# Patient Record
Sex: Male | Born: 1950 | Hispanic: No | Marital: Single | Smoking: Current every day smoker
Health system: Southern US, Community
[De-identification: ages and names within clinical notes are randomized; demographics above are authoritative.]

## PROBLEM LIST (undated history)

## (undated) DIAGNOSIS — Z720 Tobacco use: Secondary | ICD-10-CM

## (undated) DIAGNOSIS — R319 Hematuria, unspecified: Secondary | ICD-10-CM

## (undated) HISTORY — DX: Tobacco use: Z72.0

## (undated) HISTORY — DX: Hematuria, unspecified: R31.9

## (undated) HISTORY — PX: CYSTOSCOPY: SUR368

---

## 2008-05-30 DIAGNOSIS — R319 Hematuria, unspecified: Secondary | ICD-10-CM

## 2008-05-30 HISTORY — DX: Hematuria, unspecified: R31.9

## 2010-05-11 ENCOUNTER — Emergency Department (HOSPITAL_COMMUNITY)
Admission: EM | Admit: 2010-05-11 | Discharge: 2010-05-11 | Payer: Self-pay | Source: Home / Self Care | Admitting: Emergency Medicine

## 2017-12-01 ENCOUNTER — Ambulatory Visit (INDEPENDENT_AMBULATORY_CARE_PROVIDER_SITE_OTHER): Payer: Medicare HMO | Admitting: Internal Medicine

## 2017-12-01 ENCOUNTER — Encounter: Payer: Self-pay | Admitting: Internal Medicine

## 2017-12-01 ENCOUNTER — Other Ambulatory Visit: Payer: Self-pay

## 2017-12-01 VITALS — BP 114/75 | HR 77 | Temp 98.6°F | Ht 72.0 in | Wt 134.5 lb

## 2017-12-01 DIAGNOSIS — Z87891 Personal history of nicotine dependence: Secondary | ICD-10-CM | POA: Diagnosis not present

## 2017-12-01 DIAGNOSIS — R3912 Poor urinary stream: Secondary | ICD-10-CM | POA: Diagnosis not present

## 2017-12-01 DIAGNOSIS — R319 Hematuria, unspecified: Secondary | ICD-10-CM | POA: Diagnosis not present

## 2017-12-01 DIAGNOSIS — Z0000 Encounter for general adult medical examination without abnormal findings: Secondary | ICD-10-CM

## 2017-12-01 DIAGNOSIS — R310 Gross hematuria: Secondary | ICD-10-CM | POA: Insufficient documentation

## 2017-12-01 DIAGNOSIS — R768 Other specified abnormal immunological findings in serum: Secondary | ICD-10-CM

## 2017-12-01 LAB — POCT URINALYSIS DIPSTICK
Bilirubin, UA: NEGATIVE
Glucose, UA: NEGATIVE
KETONES UA: NEGATIVE
Nitrite, UA: NEGATIVE
PROTEIN UA: POSITIVE — AB
Spec Grav, UA: 1.025 (ref 1.010–1.025)
UROBILINOGEN UA: 1 U/dL
pH, UA: 5.5 (ref 5.0–8.0)

## 2017-12-01 NOTE — Assessment & Plan Note (Addendum)
Assessment & Plan:  Patient denied any nocturia or difficulty initiating urination but does admit to a weak urinary stream at times but does feel like he empties his bladder completely. Referring to urology as above.

## 2017-12-01 NOTE — Assessment & Plan Note (Addendum)
Assessment:  Patient reports a 9-year history of hematuria which he first noticed this when started passing large blood clots in 2010. A friend gave him a medicine (unsure of what) which he thinks resolved the bleeding although it recurred several days later. Reports being seen by a urologist who performed a cystoscopy and saw something that 'if it wasn't removed would turn into cancer.' He reports having a procedure (couldn't give more details) and missing his follow-up cystoscopy (because it was too painful). He denies any family history of malignancy.  He "knows" something isn't right but hasn't seen a doctor since 2013 because he's afraid of needles. Bleeding can last anywhere from 1-3 weeks with 2-3 days without obvious blood between episodes. Admits to some weight loss recently (relays this is from travel and not liking the food) and weak stream but otherwise feels well. Has occasional dysuria which is resolved with increased water intake (pt states he drinks very little fluid). UA dipstick in clinic with sediment and large blood.   Plan: Obviously his nearly 10-year history of hematuria and history of abnormal cystoscopy is concerning for urologic malignancy in this former smoker.  Will refer patient urgently to Urology and obtain US Kidneys and Bladder to evaluate for large masses as patients renal function is unknown.  -Referral to Urology -Formal UA with microscopy -CBC w diff -Ferritin -Renal and bladder [place]

## 2017-12-01 NOTE — Patient Instructions (Signed)
  Fue genial conocerte hoy. Gracias por venir a la oficina de los mdicos para ser visto.  Gracias por hablar sobre sus sntomas urinarios. Es importante que lo llevemos al mdico de vejiga (urlogo).  Tambin orden un ultrasonido, que es un estudio de imgenes sin Engineer, miningdolor. Esto mirar sus riones y Geophysicist/field seismologistvejiga.  Tambin he ordenado varios laboratorios. Por favor, pase por el laboratorio en su salida.   Te llamarn con las citas.  Por favor regrese a la Market researcherclnica dentro de 1 mes para seguimiento.

## 2017-12-01 NOTE — Assessment & Plan Note (Addendum)
Assessment & Plan: Obtaining HIV and HepC screens and CMET with todays labs. He has never had a colonoscopy however given his hematuria and concern for urologic malignancy, will defer for now.

## 2017-12-01 NOTE — Progress Notes (Signed)
Medicine attending: Medical history, presenting problems, physical findings, and medications, reviewed with resident physician Dr Toma CopierBethany Molt on the day of the patient visit and I concur with her evaluation and management plan. New to clinic. Hematuria intermittent X 10 years! Declined follow up at time of initial eval.  Needs baseline labs, US KUB, Urology referral.

## 2017-12-01 NOTE — Progress Notes (Signed)
   CC: Establish care, hematuria  HPI:  [name] is a Spanish-Speaking 67 y.o. M who presents today to establish care. He has no acute complaints today but does discuss a 9-year history of hematuria. The interview was conducted using a Radiation protection practitionervideo translator.   For details regarding today's visit and the status of their chronic medical issues, please refer to the assessment and plan.  Medical & Surgical History:  -Hematuria since 2010 -Former smoker -No surgeries  Family History: Patient reports no significant family history and denied history of MI, CVA or malignancy in his mother or father. Mother lived to be 6595 and father lived to be [address]. He reports he is one of 32 children.  Social History: Remote smoking history, quit in 1990's. About 2-pack years total. Drinks maybe 1-2 sips of alcohol at holiday functions. Denied drug use. 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 He is from the [place]. In domestic partnership.  Review of Systems:   General: Admits to 10-lb weight loss recently. Denies fevers, chills, or excessive fatigue HEENT: Denies headache, recurrent nosebleeds, changes in vision, sore throat, dysphagia Cardiac: Denies CP, SOB, palpitations Pulmonary: Denies cough, wheezes Abd: Denies abdominal pain, pelvic pain or fullness, nausea, vomiting, diarrhea, melena, hematochezia GU: Admits to hematuria and occasional dysuria. Admits to weak stream. Denies increased frequency, nocturia or incomplete emptying of bladder Extremities: Denies weakness or swelling  Physical Exam: General: Thin. Alert, in no acute distress. Pleasant and conversant HEENT: No icterus, injection or ptosis. No hoarseness or dysarthria. Oropharynx without erythema or exudate. MMM.  Cardiac: RRR, no MGR Pulmonary: CTA BL with normal WOB on RA. Able to speak in complete sentences Abd: Soft, non-tender and not distended. No appreciable abdominal masses. Liver palpable just  under costal margin. +bs Extremities: Warm, perfused. No significant pedal edema.   Vitals:   12/01/17 1337  BP: 114/75  Pulse: 77  Temp: 98.6 F (37 C)  TempSrc: Oral  SpO2: 100%  Weight: 134 lb 8 oz (61 kg)  Height: 6' (1.829 m)   Body mass index is 18.24 kg/m.  Assessment & Plan:   See Encounters Tab for problem based charting.  Patient discussed with Dr. Cyndie ChimeGranfortuna

## 2017-12-02 LAB — CBC WITH DIFFERENTIAL/PLATELET
BASOS ABS: 0 10*3/uL (ref 0.0–0.2)
BASOS: 1 %
EOS (ABSOLUTE): 0.1 10*3/uL (ref 0.0–0.4)
Eos: 1 %
Hematocrit: 44.2 % (ref 37.5–51.0)
Hemoglobin: 14.7 g/dL (ref 13.0–17.7)
IMMATURE GRANS (ABS): 0 10*3/uL (ref 0.0–0.1)
Immature Granulocytes: 0 %
LYMPHS ABS: 2.1 10*3/uL (ref 0.7–3.1)
Lymphs: 36 %
MCH: 29.2 pg (ref 26.6–33.0)
MCHC: 33.3 g/dL (ref 31.5–35.7)
MCV: 88 fL (ref 79–97)
MONOS ABS: 0.5 10*3/uL (ref 0.1–0.9)
Monocytes: 9 %
NEUTROS ABS: 3.1 10*3/uL (ref 1.4–7.0)
Neutrophils: 53 %
PLATELETS: 211 10*3/uL (ref 150–450)
RBC: 5.04 x10E6/uL (ref 4.14–5.80)
RDW: 13.4 % (ref 12.3–15.4)
WBC: 5.8 10*3/uL (ref 3.4–10.8)

## 2017-12-02 LAB — MICROSCOPIC EXAMINATION
BACTERIA UA: NONE SEEN
Casts: NONE SEEN /lpf
RBC, UA: >30 /hpf — AB (ref 0–2)
WBC, UA: >30 /hpf — AB (ref 0–5)

## 2017-12-02 LAB — URINALYSIS, COMPLETE
Bilirubin, UA: NEGATIVE
Glucose, UA: NEGATIVE
KETONES UA: NEGATIVE
NITRITE UA: NEGATIVE
PH UA: 5.5 (ref 5.0–7.5)
SPEC GRAV UA: 1.016 (ref 1.005–1.030)
Urobilinogen, Ur: 1 mg/dL (ref 0.2–1.0)

## 2017-12-02 LAB — CMP14 + ANION GAP
A/G RATIO: 2.1 (ref 1.2–2.2)
ALT: 23 IU/L (ref 0–44)
AST: 26 IU/L (ref 0–40)
Albumin: 4.2 g/dL (ref 3.6–4.8)
Alkaline Phosphatase: 79 IU/L (ref 39–117)
Anion Gap: 13 mmol/L (ref 10.0–18.0)
BILIRUBIN TOTAL: 0.5 mg/dL (ref 0.0–1.2)
BUN / CREAT RATIO: 24 (ref 10–24)
BUN: 27 mg/dL (ref 8–27)
CALCIUM: 9 mg/dL (ref 8.6–10.2)
CHLORIDE: 107 mmol/L — AB (ref 96–106)
CO2: 23 mmol/L (ref 20–29)
Creatinine, Ser: 1.14 mg/dL (ref 0.76–1.27)
GFR, EST AFRICAN AMERICAN: 77 mL/min/{1.73_m2} (ref >59)
GFR, EST NON AFRICAN AMERICAN: 66 mL/min/{1.73_m2} (ref >59)
GLUCOSE: 67 mg/dL (ref 65–99)
Globulin, Total: 2 g/dL (ref 1.5–4.5)
POTASSIUM: 4.4 mmol/L (ref 3.5–5.2)
Sodium: 143 mmol/L (ref 134–144)
TOTAL PROTEIN: 6.2 g/dL (ref 6.0–8.5)

## 2017-12-02 LAB — FERRITIN: FERRITIN: 44 ng/mL (ref 30–400)

## 2017-12-02 LAB — HEPATITIS C ANTIBODY: Hep C Virus Ab: >11 s/co ratio — ABNORMAL HIGH (ref 0.0–0.9)

## 2017-12-02 LAB — HIV ANTIBODY (ROUTINE TESTING W REFLEX): HIV Screen 4th Generation wRfx: NONREACTIVE

## 2017-12-04 DIAGNOSIS — R768 Other specified abnormal immunological findings in serum: Secondary | ICD-10-CM | POA: Insufficient documentation

## 2017-12-04 NOTE — Assessment & Plan Note (Signed)
Addendum: HepC Ab + on routine health screen. Getting HepC RNA load; further management based on result.

## 2017-12-04 NOTE — Addendum Note (Signed)
Addended by: Mable FillMOLT, Totiana Everson L on: 12/04/2017 08:56 AM   Modules accepted: Orders

## 2017-12-06 ENCOUNTER — Other Ambulatory Visit (INDEPENDENT_AMBULATORY_CARE_PROVIDER_SITE_OTHER): Payer: Medicare HMO

## 2017-12-06 DIAGNOSIS — R768 Other specified abnormal immunological findings in serum: Secondary | ICD-10-CM | POA: Diagnosis not present

## 2017-12-08 ENCOUNTER — Other Ambulatory Visit: Payer: Self-pay | Admitting: Internal Medicine

## 2017-12-08 DIAGNOSIS — R768 Other specified abnormal immunological findings in serum: Secondary | ICD-10-CM

## 2017-12-08 LAB — HCV RNA QUANT
HCV log10: 5.501 log10 IU/mL
HEPATITIS C QUANTITATION: 317000 [IU]/mL

## 2017-12-13 ENCOUNTER — Encounter: Payer: Self-pay | Admitting: Internal Medicine

## 2017-12-15 ENCOUNTER — Encounter: Payer: Self-pay | Admitting: Family

## 2017-12-15 ENCOUNTER — Ambulatory Visit (INDEPENDENT_AMBULATORY_CARE_PROVIDER_SITE_OTHER): Payer: Medicare HMO | Admitting: Family

## 2017-12-15 VITALS — BP 122/78 | HR 65 | Temp 97.7°F | Wt 132.0 lb

## 2017-12-15 DIAGNOSIS — B182 Chronic viral hepatitis C: Secondary | ICD-10-CM

## 2017-12-15 NOTE — Progress Notes (Signed)
Subjective:    Patient ID: Gregory Davis, male    DOB: 15-Nov-1950, 67 yGregoryo.   MRN: [id number]  Chief Complaint  Patient presents with  . Hepatitis C    HPI:  Gregory Davis is a 67 yGregoryo. male who presents today for initial evaluation and treatment of Hepatitis C. MrGregory Davis speaks some English and requesting that his wife translate and refuses Radiation protection practitioner.   Gregory Davis,. Gregory Davis was first diagnosed with Hepatitis C in 2011. He had a positive antibody test on 7/5 and found to have a viral load of 317,000. He has a history of cocaine use but has not used IV drugs, and denies tattoos, sharing of razors/toothbrushes. He has never been treated for Hepatitis C. Originally from the [place] and believes that he may have acquired Hepatitis C during vaccinations. He has received the Hepatitis B vaccination in the past. He is currently asymptomatic. No personal or family history of liver disease.   No Known Allergies    No outpatient medications prior to visit.   No facility-administered medications prior to visit.      Past Medical History:  Diagnosis Date  . Hematuria 2010  . Tobacco use    Briefly in 90s      Past Surgical History:  Procedure Laterality Date  . CYSTOSCOPY     2010 at [facility] in [place] 'saw something that if it wasnt removed would turn into cancer'      No family history on file.    Social History   Socioeconomic History  . Marital status: Single    Spouse name: Not on file  . Number of children: Not on file  . Years of education: Not on file  . Highest education level: Not on file  Occupational History  . Not on file  Social Needs  . Financial resource strain: Not on file  . Food insecurity:    Worry: Not on file    Inability: Not on file  . Transportation needs:    Medical: Not on file    Non-medical: Not on file  Tobacco Use  . Smoking status: Current Every Day Smoker  . Smokeless tobacco: Never Used  Substance and Sexual  Activity  . Alcohol use: Yes    Comment: 1-2 sips at holidays  . Drug use: Never  . Sexual activity: Not Currently  Lifestyle  . Physical activity:    Days per week: Not on file    Minutes per session: Not on file  . Stress: Not on file  Relationships  . Social connections:    Talks on phone: Not on file    Gets together: Not on file    Attends religious service: Not on file    Active member of club or organization: Not on file    Attends meetings of clubs or organizations: Not on file    Relationship status: Not on file  . Intimate partner violence:    Fear of current or ex partner: Not on file    Emotionally abused: Not on file    Physically abused: Not on file    Forced sexual activity: Not on file  Other Topics Concern  . Not on file  Social History Narrative  . Not on file      Review of Systems  Constitutional: Negative for chills, diaphoresis, fatigue and fever.  Respiratory: Negative for cough, chest tightness, shortness of breath and wheezing.   Cardiovascular: Negative for chest pain.  Gastrointestinal: Negative for  abdominal distention, abdominal pain, constipation, diarrhea, nausea and vomiting.  Neurological: Negative for weakness and headaches.  Hematological: Does not bruise/bleed easily.       Objective:    BP 122/78   Pulse 65   Temp 97Gregory7 F (36Gregory5 C) (Oral)   Wt 132 lb (59Gregory9 kg)   BMI 17Gregory90 kg/m  Nursing note and vital signs reviewed.  Physical Exam  Constitutional: He is oriented to person, place, and time. He appears well-developed. No distress.  Cardiovascular: Normal rate, regular rhythm, normal heart sounds and intact distal pulses. Exam reveals no gallop and no friction rub.  No murmur heard. Pulmonary/Chest: Effort normal and breath sounds normal. No respiratory distress. He has no wheezes. He has no rales. He exhibits no tenderness.  Abdominal: Soft. Bowel sounds are normal. He exhibits no distension, no ascites and no mass. There is  no hepatosplenomegaly. There is no tenderness. There is no rebound and no guarding.  Neurological: He is alert and oriented to person, place, and time.  Skin: Skin is warm and dry.  Psychiatric: He has a normal mood and affect. His behavior is normal. Judgment and thought content normal.        Assessment & Plan:   Problem List Items Addressed This Visit      Digestive   Chronic hepatitis C without hepatic coma (HCC) - Primary    Gregory Davis. Gregory Davis has chronic Hepatitis C which was initially diagnosed in 2011 and he has never received treatment. His most recent blood work confirmed the presence of the virus with a viral load of 317,000. He believes he likely acquired Hepatitis C from previously received vaccination although does have history of cocaine usage. He is asymptomatic on physical exam and liver function tests are within the normal ranges. We discussed the transmission, progression, and available treatments. He was encouraged to avoid alcohol and limit acetaminophen intake. I will check his Genotype today and obtain elastography to determine fibrosis. I will also check his immune status of Hepatitis B.  Plan for likely treatment with Epclusa or Harvoni pending imaging and lab results.       Relevant Orders   US ABDOMEN COMPLETE W/ELASTOGRAPHY   Hepatitis C genotype   Hepatitis B surface antigen   Hepatitis C Antibody      Gregory Davis does not currently have medications on file.   Follow-up: Return pending ultrasound and lab work results.   Jeanine LuzGregory Codee Tutson, FNP [facility]

## 2017-12-15 NOTE — Assessment & Plan Note (Signed)
[name] has chronic Hepatitis C which was initially diagnosed in 2011 and he has never received treatment. His most recent blood work confirmed the presence of the virus with a viral load of 317,000. He believes he likely acquired Hepatitis C from previously received vaccination although does have history of cocaine usage. He is asymptomatic on physical exam and liver function tests are within the normal ranges. We discussed the transmission, progression, and available treatments. He was encouraged to avoid alcohol and limit acetaminophen intake. I will check his Genotype today and obtain elastography to determine fibrosis. I will also check his immune status of Hepatitis B.  Plan for likely treatment with Epclusa or Harvoni pending imaging and lab results.

## 2017-12-15 NOTE — Patient Instructions (Signed)
Nice to meet you!  We will check your lab work today.   We will schedule you for an ultrasound of your abdomen and liver.  Once we have all the information we will send in your medication.

## 2017-12-19 LAB — HEPATITIS C GENOTYPE

## 2017-12-20 ENCOUNTER — Ambulatory Visit (HOSPITAL_COMMUNITY)
Admission: RE | Admit: 2017-12-20 | Discharge: 2017-12-20 | Disposition: A | Discharge status: Home / Self Care | Payer: Medicare HMO | Source: Ambulatory Visit | Attending: Family | Admitting: Family

## 2017-12-20 ENCOUNTER — Other Ambulatory Visit: Payer: Self-pay | Admitting: Family

## 2017-12-20 DIAGNOSIS — B182 Chronic viral hepatitis C: Secondary | ICD-10-CM

## 2017-12-20 LAB — HEPATITIS C ANTIBODY
HEP C AB: REACTIVE — AB
SIGNAL TO CUT-OFF: 29 — AB (ref <1.00)

## 2017-12-20 LAB — HEPATITIS B SURFACE ANTIGEN: Hepatitis B Surface Ag: NONREACTIVE

## 2017-12-20 LAB — HCV RNA,QUANTITATIVE REAL TIME PCR
HCV Quantitative Log: 6.07 Log IU/mL — ABNORMAL HIGH
HCV RNA, PCR, QN: 1180000 IU/mL — ABNORMAL HIGH

## 2017-12-20 MED ORDER — LEDIPASVIR-SOFOSBUVIR 90-400 MG PO TABS: 1.0000 | ORAL_TABLET | Freq: Every day | ORAL | 1 refills | Status: AC

## 2017-12-25 ENCOUNTER — Telehealth: Payer: Self-pay | Admitting: Pharmacist

## 2017-12-25 NOTE — Telephone Encounter (Signed)
Patient has been approved for Harvoni x 8 weeks. Spoke with patient today and counseled on how to take Harvoni including one pill once daily at the same time each day.  Counseled on the importance of not missing any doses once starting.  Counseled on possible side effects such as fatigue, nausea, and headaches.  He will come to the clinic tomorrow to pick up his 1st month. He is not on any medications OTC or prescription for acid reflux.

## 2017-12-26 ENCOUNTER — Encounter: Payer: Self-pay | Admitting: Pharmacy Technician

## 2017-12-26 ENCOUNTER — Ambulatory Visit: Payer: Medicare HMO | Admitting: Pharmacy Technician

## 2018-01-03 ENCOUNTER — Ambulatory Visit (HOSPITAL_COMMUNITY): Payer: Medicare HMO

## 2018-01-25 ENCOUNTER — Ambulatory Visit: Payer: Medicare HMO

## 2018-03-13 ENCOUNTER — Ambulatory Visit: Payer: Medicare HMO

## 2019-01-06 IMAGING — US US ABDOMEN COMPLETE W/ ELASTOGRAPHY
1 series · 13 of 25 positions shown · non-contrast
Comparison: None.

CLINICAL DATA: Chronic hepatitis-C without hepatic coma.



[Series 1: us abdomen complete w/ elastography · 0.17mm/px · 13 of 88 slices shown]
[im 1/88]
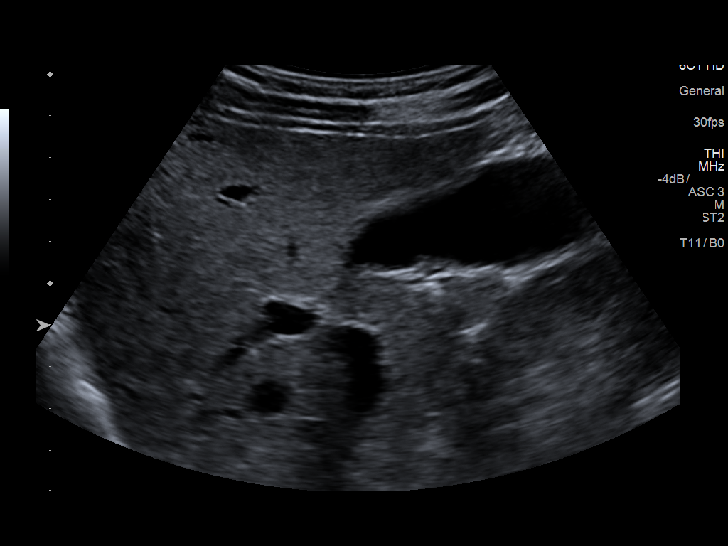
[im 8/88]
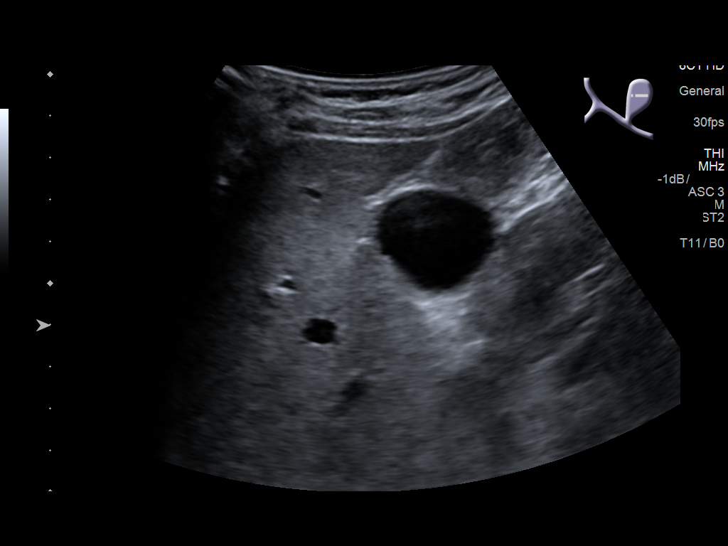
[im 15/88]
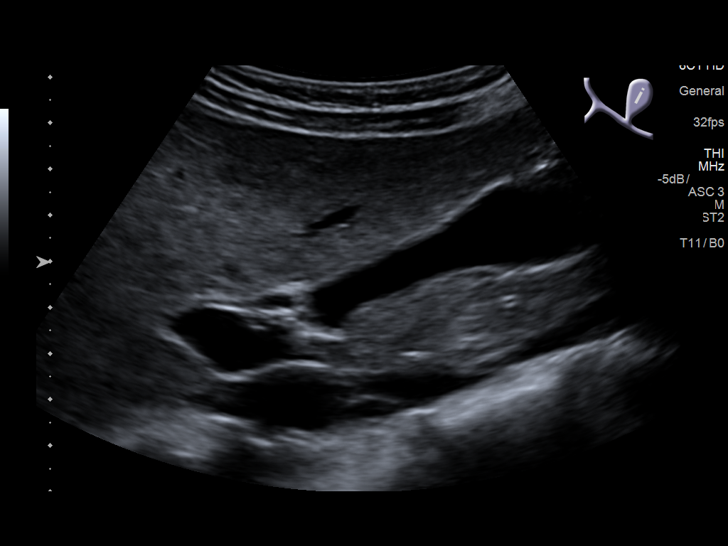
[im 22/88]
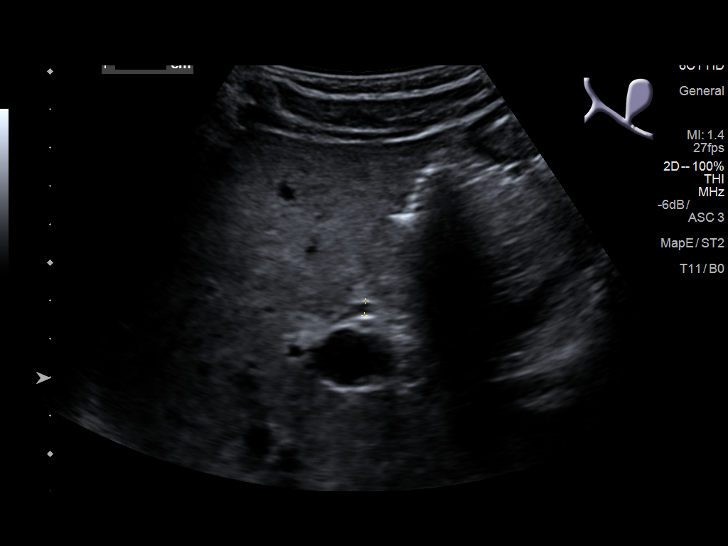
[im 30/88]
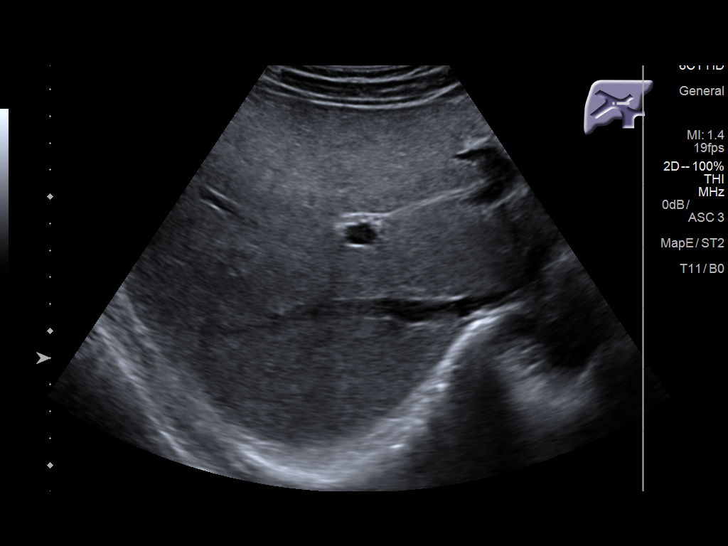
[im 37/88]
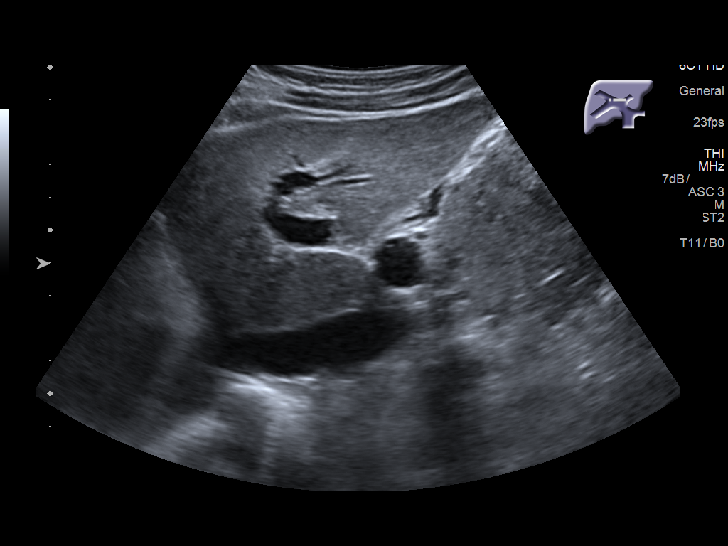
[im 44/88]
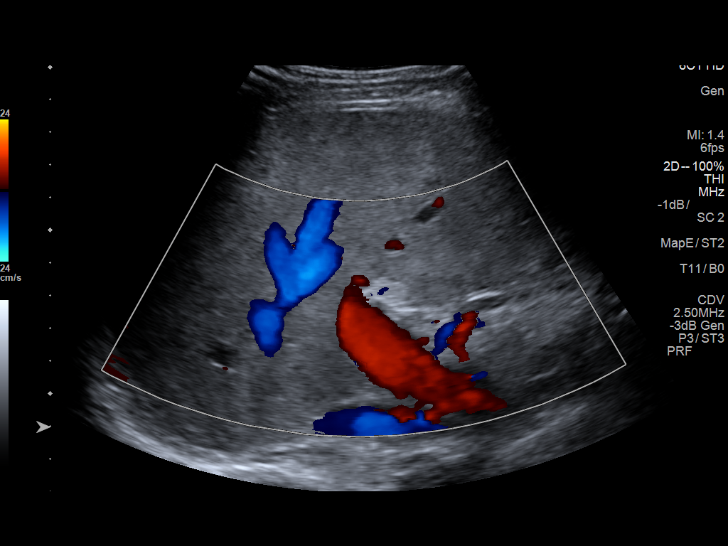
[im 51/88]
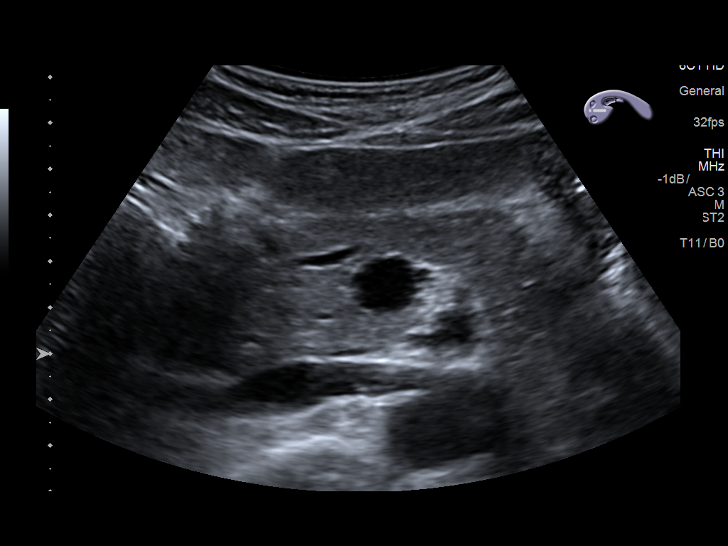
[im 59/88]
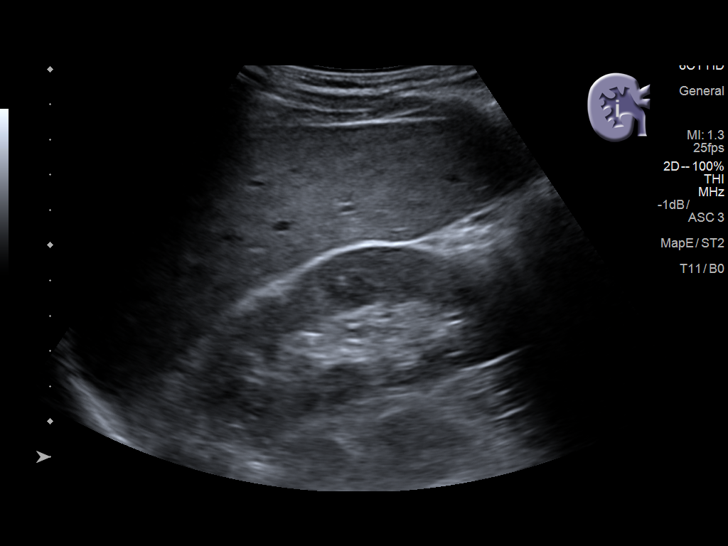
[im 66/88]
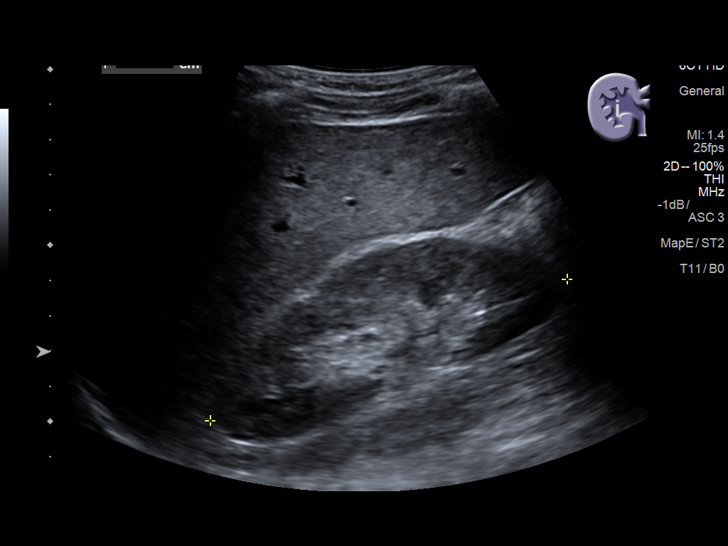
[im 73/88]
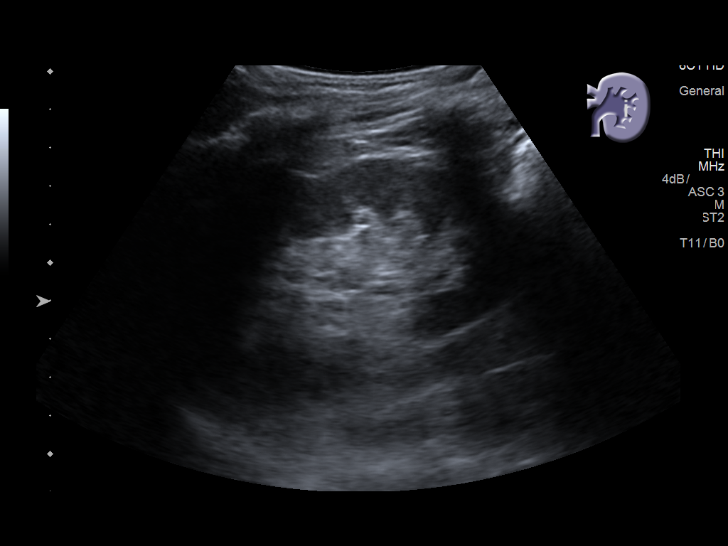
[im 80/88]
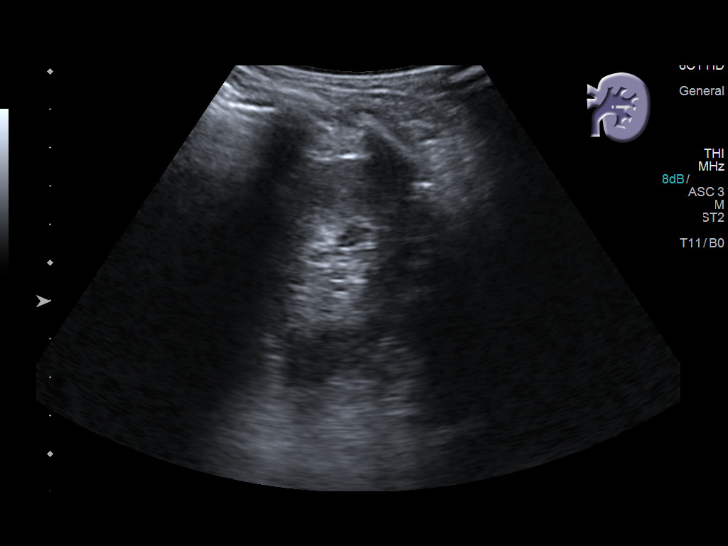
[im 88/88]
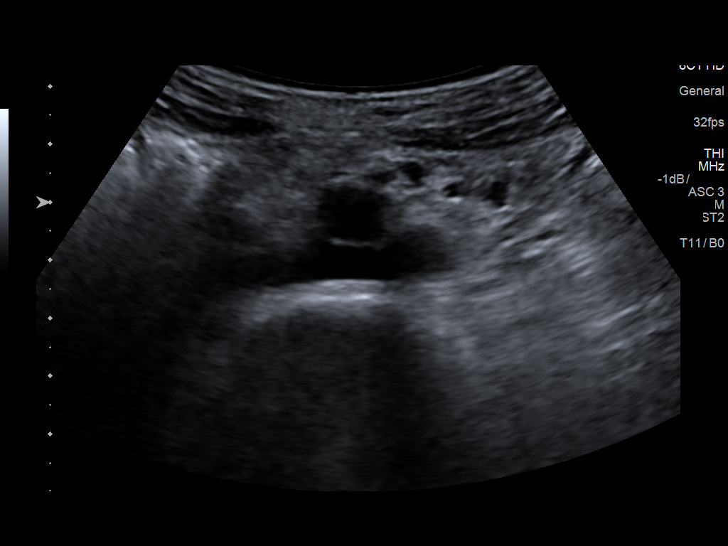

[13 of 25 positions shown; findings below may reference images not displayed]

FINDINGS: ULTRASOUND ABDOMEN

Gallbladder: No gallstones or wall thickening visualized. No
sonographic Murphy sign noted by sonographer.

Common bile duct: Diameter: 4 mm, within normal limits.

Liver: No focal lesion identified. Within normal limits in
parenchymal echogenicity. Portal vein is patent on color Doppler
imaging with normal direction of blood flow towards the liver.

IVC: No abnormality visualized.

Pancreas: Visualized portion unremarkable.

Spleen: Size and appearance within normal limits.

Right Kidney: Length: 10.9 cm. Echogenicity within normal limits. No
mass or hydronephrosis visualized.

Left Kidney: Length: 10.3 cm. Echogenicity within normal limits. No
mass or hydronephrosis visualized.

Abdominal aorta: No aneurysm visualized.

Other findings: None.

ULTRASOUND HEPATIC ELASTOGRAPHY

Device: Siemens Helix VTQ

Patient position: Left Lateral Decubitus

Transducer 6C1

Number of measurements: 10

Hepatic segment:  8

Median velocity:   1.13 m/sec

IQR:

IQR/Median velocity ratio:

Corresponding Metavir fibrosis score:  F0/F1

Risk of fibrosis: Minimal

Limitations of exam: None

Please note that abnormal shear wave velocities may also be
identified in clinical settings other than with hepatic fibrosis,
such as: acute hepatitis, elevated right heart and central venous
pressures including use of beta blockers, Ceola disease
(Hector Reinaldo), infiltrative processes such as
mastocytosis/amyloidosis/infiltrative tumor, extrahepatic
cholestasis, in the post-prandial state, and liver transplantation.
Correlation with patient history, laboratory data, and clinical
condition recommended.
IMPRESSION: ULTRASOUND ABDOMEN:

Negative. No hepatobiliary or other significant abnormality
identified.

ULTRASOUND HEPATIC ELASTOGRAPHY:

Median hepatic shear wave velocity is calculated at 1.13 m/sec.

Corresponding Metavir fibrosis score is F0/F1.

Risk of fibrosis is Minimal.

Follow-up: None required
